# Patient Record
Sex: Female | Born: 1995 | Race: White | Hispanic: No | Marital: Single | Smoking: Current every day smoker
Health system: Southern US, Community
[De-identification: ages and names within clinical notes are randomized; demographics above are authoritative.]

## PROBLEM LIST (undated history)

## (undated) HISTORY — PX: TONSILLECTOMY: SUR1361

---

## 2015-06-12 ENCOUNTER — Emergency Department
Admission: EM | Admit: 2015-06-12 | Discharge: 2015-06-12 | Disposition: A | Discharge status: Home / Self Care | Payer: Self-pay | Attending: Emergency Medicine | Admitting: Emergency Medicine

## 2015-06-12 ENCOUNTER — Emergency Department: Payer: Self-pay

## 2015-06-12 DIAGNOSIS — W01198A Fall on same level from slipping, tripping and stumbling with subsequent striking against other object, initial encounter: Secondary | ICD-10-CM | POA: Insufficient documentation

## 2015-06-12 DIAGNOSIS — Y9289 Other specified places as the place of occurrence of the external cause: Secondary | ICD-10-CM | POA: Insufficient documentation

## 2015-06-12 DIAGNOSIS — M545 Low back pain, unspecified: Secondary | ICD-10-CM

## 2015-06-12 DIAGNOSIS — Y9389 Activity, other specified: Secondary | ICD-10-CM | POA: Insufficient documentation

## 2015-06-12 DIAGNOSIS — S3992XA Unspecified injury of lower back, initial encounter: Secondary | ICD-10-CM | POA: Insufficient documentation

## 2015-06-12 DIAGNOSIS — Y998 Other external cause status: Secondary | ICD-10-CM | POA: Insufficient documentation

## 2015-06-12 DIAGNOSIS — Z3202 Encounter for pregnancy test, result negative: Secondary | ICD-10-CM | POA: Insufficient documentation

## 2015-06-12 LAB — PREGNANCY, URINE: Preg Test, Ur: NEGATIVE

## 2015-06-12 MED ORDER — DIAZEPAM 2 MG PO TABS: 2.0000 mg | ORAL_TABLET | Freq: Three times a day (TID) | ORAL | Status: AC | PRN

## 2015-06-12 MED ORDER — HYDROCODONE-ACETAMINOPHEN 5-325 MG PO TABS: 1.0000 | ORAL_TABLET | Freq: Four times a day (QID) | ORAL | Status: AC | PRN

## 2015-06-12 MED ORDER — DIAZEPAM 2 MG PO TABS
2.0000 mg | ORAL_TABLET | Freq: Once | ORAL | Status: AC
  Administered 2015-06-12: 2 mg via ORAL
  Filled 2015-06-12: qty 1

## 2015-06-12 MED ORDER — HYDROCODONE-ACETAMINOPHEN 5-325 MG PO TABS
1.0000 | ORAL_TABLET | Freq: Once | ORAL | Status: AC
  Administered 2015-06-12: 1 via ORAL
  Filled 2015-06-12: qty 1

## 2015-06-12 MED ORDER — KETOROLAC TROMETHAMINE 30 MG/ML IJ SOLN
60.0000 mg | Freq: Once | INTRAMUSCULAR | Status: AC
  Administered 2015-06-12: 60 mg via INTRAMUSCULAR
  Filled 2015-06-12: qty 2

## 2015-06-12 MED ORDER — IBUPROFEN 800 MG PO TABS: 800.0000 mg | ORAL_TABLET | Freq: Three times a day (TID) | ORAL | Status: AC | PRN

## 2015-06-12 NOTE — ED Notes (Signed)
Pt ambulatory to triage with slow but steady gait. Pt reports she has hx of DDD and has had low back pain for over 10 months. Pt reports about 1 week ago she slipped and fell in the tub. Pt reports having worse low back pain and it is not getting any better.

## 2015-06-12 NOTE — Discharge Instructions (Signed)
1. You may take medicines as needed for pain and muscle spasms (Motrin/Norco/Valium #15). 2. Apply moist heat to affected area several times daily. 3. Return to the ER for worsening symptoms, persistent vomiting, losing control her bowel or bladder or other concerns.  Back Pain, Adult Back pain is very common in adults.The cause of back pain is rarely dangerous and the pain often gets better over time.The cause of your back pain may not be known. Some common causes of back pain include:  Strain of the muscles or ligaments supporting the spine.  Wear and tear (degeneration) of the spinal disks.  Arthritis.  Direct injury to the back. For many people, back pain may return. Since back pain is rarely dangerous, most people can learn to manage this condition on their own. HOME CARE INSTRUCTIONS Watch your back pain for any changes. The following actions may help to lessen any discomfort you are feeling:  Remain active. It is stressful on your back to sit or stand in one place for long periods of time. Do not sit, drive, or stand in one place for more than 30 minutes at a time. Take short walks on even surfaces as soon as you are able.Try to increase the length of time you walk each day.  Exercise regularly as directed by your health care provider. Exercise helps your back heal faster. It also helps avoid future injury by keeping your muscles strong and flexible.  Do not stay in bed.Resting more than 1-2 days can delay your recovery.  Pay attention to your body when you bend and lift. The most comfortable positions are those that put less stress on your recovering back. Always use proper lifting techniques, including:  Bending your knees.  Keeping the load close to your body.  Avoiding twisting.  Find a comfortable position to sleep. Use a firm mattress and lie on your side with your knees slightly bent. If you lie on your back, put a pillow under your knees.  Avoid feeling anxious or  stressed.Stress increases muscle tension and can worsen back pain.It is important to recognize when you are anxious or stressed and learn ways to manage it, such as with exercise.  Take medicines only as directed by your health care provider. Over-the-counter medicines to reduce pain and inflammation are often the most helpful.Your health care provider may prescribe muscle relaxant drugs.These medicines help dull your pain so you can more quickly return to your normal activities and healthy exercise.  Apply ice to the injured area:  Put ice in a plastic bag.  Place a towel between your skin and the bag.  Leave the ice on for 20 minutes, 2-3 times a day for the first 2-3 days. After that, ice and heat may be alternated to reduce pain and spasms.  Maintain a healthy weight. Excess weight puts extra stress on your back and makes it difficult to maintain good posture. SEEK MEDICAL CARE IF:  You have pain that is not relieved with rest or medicine.  You have increasing pain going down into the legs or buttocks.  You have pain that does not improve in one week.  You have night pain.  You lose weight.  You have a fever or chills. SEEK IMMEDIATE MEDICAL CARE IF:   You develop new bowel or bladder control problems.  You have unusual weakness or numbness in your arms or legs.  You develop nausea or vomiting.  You develop abdominal pain.  You feel faint.   This information  is not intended to replace advice given to you by your health care provider. Make sure you discuss any questions you have with your health care provider.   Document Released: 07/24/2005 Document Revised: 08/14/2014 Document Reviewed: 11/25/2013 Elsevier Interactive Patient Education 2016 [employer]  [employer] Therapy Heat therapy can help ease sore, stiff, injured, and tight muscles and joints. Heat relaxes your muscles, which may help ease your pain.  RISKS AND COMPLICATIONS If you have any of the following  conditions, do not use heat therapy unless your health care provider has approved:  Poor circulation.  Healing wounds or scarred skin in the area being treated.  Diabetes, heart disease, or high blood pressure.  Not being able to feel (numbness) the area being treated.  Unusual swelling of the area being treated.  Active infections.  Blood clots.  Cancer.  Inability to communicate pain. This may include young children and people who have problems with their brain function (dementia).  Pregnancy. Heat therapy should only be used on old, pre-existing, or long-lasting (chronic) injuries. Do not use heat therapy on new injuries unless directed by your health care provider. HOW TO USE HEAT THERAPY There are several different kinds of heat therapy, including:  Moist heat pack.  Warm water bath.  Hot water bottle.  Electric heating pad.  Heated gel pack.  Heated wrap.  Electric heating pad. Use the heat therapy method suggested by your health care provider. Follow your health care provider's instructions on when and how to use heat therapy. GENERAL HEAT THERAPY RECOMMENDATIONS  Do not sleep while using heat therapy. Only use heat therapy while you are awake.  Your skin may turn pink while using heat therapy. Do not use heat therapy if your skin turns red.  Do not use heat therapy if you have new pain.  High heat or long exposure to heat can cause burns. Be careful when using heat therapy to avoid burning your skin.  Do not use heat therapy on areas of your skin that are already irritated, such as with a rash or sunburn. SEEK MEDICAL CARE IF:  You have blisters, redness, swelling, or numbness.  You have new pain.  Your pain is worse. MAKE SURE YOU:  Understand these instructions.  Will watch your condition.  Will get help right away if you are not doing well or get worse.   This information is not intended to replace advice given to you by your health care  provider. Make sure you discuss any questions you have with your health care provider.   Document Released: 10/16/2011 Document Revised: 08/14/2014 Document Reviewed: 09/16/2013 Elsevier Interactive Patient Education [employer]

## 2015-06-12 NOTE — ED Provider Notes (Signed)
[facility] Emergency Department Provider Note  ____________________________________________  Time seen: Approximately 1:04 AM  I have reviewed the triage vital signs and the nursing notes.   HISTORY  Chief Complaint Back Pain    HPI Drucilla SchmidtKali Rae Kihara is a 19 y.o. female who presents to the ED from home with a chief complaint of low back pain.Reports she has a history of degenerative disks and has had low back pain 10 months. Reports she slipped and fell in the tub approximately one week ago and presents with increasing lower back pain. Denies radiation or associated symptoms of bowel/bladder incontinence, numbness or tingling. Her previous prescription of hydrocodone helps her pain somewhat, movement makes her pain worse.   Past Medical history DDD   There are no active problems to display for this patient.   Past Surgical history None for back surgery  No current outpatient prescriptions on file.  Allergies Review of patient's allergies indicates no known allergies.  No family history on file.  Social History Social History  Substance Use Topics  . Smoking status: Not on file  . Smokeless tobacco: Not on file  . Alcohol Use: Not on file   no recent alcohol  Review of Systems Constitutional: No fever/chills Eyes: No visual changes. ENT: No sore throat. Cardiovascular: Denies chest pain. Respiratory: Denies shortness of breath. Gastrointestinal: No abdominal pain.  No nausea, no vomiting.  No diarrhea.  No constipation. Genitourinary: Negative for dysuria. Musculoskeletal: Positive for back pain. Skin: Negative for rash. Neurological: Negative for headaches, focal weakness or numbness.  10-point ROS otherwise negative.  ____________________________________________   PHYSICAL EXAM:  VITAL SIGNS: ED Triage Vitals  Enc Vitals Group     BP 06/12/15 0048 155/93 mmHg     Pulse Rate 06/12/15 0048 87     Resp 06/12/15 0048 18   Temp 06/12/15 0048 98.1 F (36.7 C)     Temp Source 06/12/15 0048 Oral     SpO2 06/12/15 0048 98 %     Weight 06/12/15 0048 210 lb (95.255 kg)     Height 06/12/15 0048 5\' 5"  (1.651 m)     Head Cir --      Peak Flow --      Pain Score 06/12/15 0048 8     Pain Loc --      Pain Edu? --      Excl. in GC? --     Constitutional: Alert and oriented. Well appearing and in mild acute distress. Eyes: Conjunctivae are normal. PERRL. EOMI. Head: Atraumatic. Nose: No congestion/rhinnorhea. Mouth/Throat: Mucous membranes are moist.  Oropharynx non-erythematous. Neck: No stridor.  No cervical spine tenderness to palpation. Cardiovascular: Normal rate, regular rhythm. Grossly normal heart sounds.  Good peripheral circulation. Respiratory: Normal respiratory effort.  No retractions. Lungs CTAB. Gastrointestinal: Soft and nontender. No distention. No abdominal bruits. No CVA tenderness. Musculoskeletal: Tender to palpation midline lumbar spine. No step-offs or deformities palpated. Paraspinal muscle spasms. No lower extremity tenderness nor edema.  No joint effusions. Neurologic:  Normal speech and language. No gross focal neurologic deficits are appreciated. Skin:  Skin is warm, dry and intact. No rash noted. Psychiatric: Mood and affect are normal. Speech and behavior are normal.  ____________________________________________   LABS (all labs ordered are listed, but only abnormal results are displayed)  Labs Reviewed - No data to display ____________________________________________  EKG  None ____________________________________________  RADIOLOGY  Lumbar spine x-rays (viewed by me, interpreted per Dr. Karie KirksBloomer): Negative. ____________________________________________   PROCEDURES  Procedure(s)  performed: None  Critical Care performed: No  ____________________________________________   INITIAL IMPRESSION / ASSESSMENT AND PLAN / ED COURSE  Pertinent labs & imaging results that  were available during my care of the patient were reviewed by me and considered in my medical decision making (see chart for details).  19 year old female who presents with lumbar spine pain s/p fall approximately one week ago. Will obtain imaging studies, administer IM analgesia and reassess.  ----------------------------------------- 2:51 AM on 06/12/2015 -----------------------------------------  Patient improved. Strict return precautions given. Patient verbalizes understanding and agrees with plan of care. ____________________________________________   FINAL CLINICAL IMPRESSION(S) / ED DIAGNOSES  Final diagnoses:  Midline low back pain without sciatica      Irean Hong, MD 06/12/15 [id number]

## 2015-06-24 ENCOUNTER — Emergency Department
Admission: EM | Admit: 2015-06-24 | Discharge: 2015-06-24 | Disposition: A | Discharge status: Home / Self Care | Payer: Self-pay | Attending: Emergency Medicine | Admitting: Emergency Medicine

## 2015-06-24 ENCOUNTER — Encounter: Payer: Self-pay | Admitting: Emergency Medicine

## 2015-06-24 DIAGNOSIS — Z3202 Encounter for pregnancy test, result negative: Secondary | ICD-10-CM | POA: Insufficient documentation

## 2015-06-24 DIAGNOSIS — M545 Low back pain, unspecified: Secondary | ICD-10-CM

## 2015-06-24 DIAGNOSIS — F17210 Nicotine dependence, cigarettes, uncomplicated: Secondary | ICD-10-CM | POA: Insufficient documentation

## 2015-06-24 LAB — URINALYSIS COMPLETE WITH MICROSCOPIC (ARMC ONLY)
BILIRUBIN URINE: NEGATIVE
Bacteria, UA: NONE SEEN
GLUCOSE, UA: NEGATIVE mg/dL
HGB URINE DIPSTICK: NEGATIVE
Ketones, ur: NEGATIVE mg/dL
NITRITE: NEGATIVE
PH: 5 (ref 5.0–8.0)
Protein, ur: NEGATIVE mg/dL
SPECIFIC GRAVITY, URINE: 1.016 (ref 1.005–1.030)

## 2015-06-24 LAB — POCT PREGNANCY, URINE: Preg Test, Ur: NEGATIVE

## 2015-06-24 MED ORDER — CYCLOBENZAPRINE HCL 5 MG PO TABS: 5.0000 mg | ORAL_TABLET | Freq: Three times a day (TID) | ORAL | Status: AC | PRN

## 2015-06-24 MED ORDER — TRAMADOL HCL 50 MG PO TABS
50.0000 mg | ORAL_TABLET | Freq: Once | ORAL | Status: AC
  Administered 2015-06-24: 50 mg via ORAL
  Filled 2015-06-24: qty 1

## 2015-06-24 NOTE — ED Notes (Addendum)
Patient ambulatory to triage with steady gait, without difficulty or distress noted; pt reports being seen here for lower back pack few weeks ago; c/o persistent pain but didn't f/u due to lack of insurance

## 2015-06-24 NOTE — ED Provider Notes (Signed)
[facility] Emergency Department Provider Note  ____________________________________________   I have reviewed the triage vital signs and the nursing notes.   HISTORY  Chief Complaint Back Pain    HPI Lori Perez is a 19 y.o. female who presents today complaining of low back pain. She has had low back pain she states now for 10 years. He gets worse and then gets better. She is about to start her menstrual period and that usually makes it worse. She has had no fever no chills no nausea no vomiting. She states it's been worse over the last year. She has not seen anyone for it. She has had no numbness no weakness no incontinence of bowel or recent fall since last time she was seen here. Did have negative x-rays recently. States that she occasionally takes ibuprofen for this pain but "doesn't touch it".  History reviewed. No pertinent past medical history.  There are no active problems to display for this patient.   Past Surgical History  Procedure Laterality Date  . Tonsillectomy      Current Outpatient Rx  Name  Route  Sig  Dispense  Refill  . diazepam (VALIUM) 2 MG tablet   Oral   Take 1 tablet (2 mg total) by mouth every 8 (eight) hours as needed for muscle spasms. Patient not taking: Reported on 06/24/2015   15 tablet   0   . HYDROcodone-acetaminophen (NORCO) 5-325 MG tablet   Oral   Take 1 tablet by mouth every 6 (six) hours as needed for moderate pain. Patient not taking: Reported on 06/24/2015   15 tablet   0   . ibuprofen (ADVIL,MOTRIN) 800 MG tablet   Oral   Take 1 tablet (800 mg total) by mouth every 8 (eight) hours as needed for moderate pain. Patient not taking: Reported on 06/24/2015   15 tablet   0     Allergies Review of patient's allergies indicates no known allergies.  No family history on file.  Social History Social History  Substance Use Topics  . Smoking status: Current Every Day Smoker -- 0.50 packs/day   Types: Cigarettes  . Smokeless tobacco: None  . Alcohol Use: No    Review of Systems Constitutional: No fever/chills Eyes: No visual changes. ENT: No sore throat. No stiff neck no neck pain Cardiovascular: Denies chest pain. Respiratory: Denies shortness of breath. Gastrointestinal:   no vomiting.  No diarrhea.  No constipation. Genitourinary: Negative for dysuria. Musculoskeletal: Negative lower extremity swelling Skin: Negative for rash. Neurological: Negative for headaches, focal weakness or numbness. 10-point ROS otherwise negative.  ____________________________________________   PHYSICAL EXAM:  VITAL SIGNS: ED Triage Vitals  Enc Vitals Group     BP 06/24/15 0502 145/57 mmHg     Pulse Rate 06/24/15 0502 78     Resp 06/24/15 0502 20     Temp 06/24/15 0502 97.9 F (36.6 C)     Temp Source 06/24/15 0502 Oral     SpO2 06/24/15 0502 99 %     Weight 06/24/15 0502 215 lb (97.523 kg)     Height 06/24/15 0502 5\' 5"  (1.651 m)     Head Cir --      Peak Flow --      Pain Score 06/24/15 0500 9     Pain Loc --      Pain Edu? --      Excl. in GC? --     Constitutional: Alert and oriented. Well appearing and in no  acute distress. Eyes: Conjunctivae are normal. PERRL. EOMI. Head: Atraumatic. Nose: No congestion/rhinnorhea. Mouth/Throat: Mucous membranes are moist.  Oropharynx non-erythematous. Neck: No stridor.   Nontender with no meningismus Cardiovascular: Normal rate, regular rhythm. Grossly normal heart sounds.  Good peripheral circulation. Respiratory: Normal respiratory effort.  No retractions. Lungs CTAB. Abdominal: Soft and nontender. No distention. No guarding no rebound Back:  There is tenderness to palpation in the paraspinal muscles of the left lower back no flank pain, no lesions noted, no midline discomfort there is no midline tenderness there are no lesions noted. there is no CVA tenderness Musculoskeletal: No lower extremity tenderness. No joint effusions, no  DVT signs strong distal pulses no edema Neurologic:  Normal speech and language. No gross focal neurologic deficits are appreciated. No saddle anesthesia reflexes are 2+ and symmetric right leg raise is negative Skin:  Skin is warm, dry and intact. No rash noted. Psychiatric: Mood and affect are normal. Speech and behavior are normal.  ____________________________________________   LABS (all labs ordered are listed, but only abnormal results are displayed)  Labs Reviewed  URINALYSIS COMPLETEWITH MICROSCOPIC ([facility] ONLY)  POCT PREGNANCY, URINE  POC URINE PREG, ED   ____________________________________________  EKG  I personally interpreted any EKGs ordered by me or triage  ____________________________________________  RADIOLOGY  I reviewed any imaging ordered by me or triage that were performed during my shift ____________________________________________   PROCEDURES  Procedure(s) performed: None  Critical Care performed: None  ____________________________________________   INITIAL IMPRESSION / ASSESSMENT AND PLAN / ED COURSE  Pertinent labs & imaging results that were available during my care of the patient were reviewed by me and considered in my medical decision making (see chart for details).  Patient with chronic back pain presents with her chronic back pain without injury, she is neurologically intact there's nothing to suggest cauda equina syndrome, we'll check a urine urgency and urinalysis and we will see if we can try home nonnarcotic intervention for this. I'll refer her to orthopedic surgery/spine doctors for further evaluation. Return precautions and follow-up given and understood. ____________________________________________   FINAL CLINICAL IMPRESSION(S) / ED DIAGNOSES  Final diagnoses:  None     Jeanmarie Plant, MD 06/24/15 [phone]

## 2015-06-24 NOTE — Discharge Instructions (Signed)
Back Exercises °The following exercises strengthen the muscles that help to support the back. They also help to keep the lower back flexible. Doing these exercises can help to prevent back pain or lessen existing pain. °If you have back pain or discomfort, try doing these exercises 2-3 times each day or as told by your health care provider. When the pain goes away, do them once each day, but increase the number of times that you repeat the steps for each exercise (do more repetitions). If you do not have back pain or discomfort, do these exercises once each day or as told by your health care provider. °EXERCISES °Single Knee to Chest °Repeat these steps 3-5 times for each leg: °· Lie on your back on a firm bed or the floor with your legs extended. °· Bring one knee to your chest. Your other leg should stay extended and in contact with the floor. °· Hold your knee in place by grabbing your knee or thigh. °· Pull on your knee until you feel a gentle stretch in your lower back. °· Hold the stretch for 10-30 seconds. °· Slowly release and straighten your leg. °Pelvic Tilt °Repeat these steps 5-10 times: °· Lie on your back on a firm bed or the floor with your legs extended. °· Bend your knees so they are pointing toward the ceiling and your feet are flat on the floor. °· Tighten your lower abdominal muscles to press your lower back against the floor. This motion will tilt your pelvis so your tailbone points up toward the ceiling instead of pointing to your feet or the floor. °· With gentle tension and even breathing, hold this position for 5-10 seconds. °Cat-Cow °Repeat these steps until your lower back becomes more flexible: °· Get into a hands-and-knees position on a firm surface. Keep your hands under your shoulders, and keep your knees under your hips. You may place padding under your knees for comfort. °· Let your head hang down, and point your tailbone toward the floor so your lower back becomes rounded like the  back of a cat. °· Hold this position for 5 seconds. °· Slowly lift your head and point your tailbone up toward the ceiling so your back forms a sagging arch like the back of a cow. °· Hold this position for 5 seconds. °Press-Ups °Repeat these steps 5-10 times: °· Lie on your abdomen (face-down) on the floor. °· Place your palms near your head, about shoulder-width apart. °· While you keep your back as relaxed as possible and keep your hips on the floor, slowly straighten your arms to raise the top half of your body and lift your shoulders. Do not use your back muscles to raise your upper torso. You may adjust the placement of your hands to make yourself more comfortable. °· Hold this position for 5 seconds while you keep your back relaxed. °· Slowly return to lying flat on the floor. °Bridges °Repeat these steps 10 times: °1. Lie on your back on a firm surface. °2. Bend your knees so they are pointing toward the ceiling and your feet are flat on the floor. °3. Tighten your buttocks muscles and lift your buttocks off of the floor until your waist is at almost the same height as your knees. You should feel the muscles working in your buttocks and the back of your thighs. If you do not feel these muscles, slide your feet 1-2 inches farther away from your buttocks. °4. Hold this position for 3-5   seconds. °5. Slowly lower your hips to the starting position, and allow your buttocks muscles to relax completely. °If this exercise is too easy, try doing it with your arms crossed over your chest. °Abdominal Crunches °Repeat these steps 5-10 times: °1. Lie on your back on a firm bed or the floor with your legs extended. °2. Bend your knees so they are pointing toward the ceiling and your feet are flat on the floor. °3. Cross your arms over your chest. °4. Tip your chin slightly toward your chest without bending your neck. °5. Tighten your abdominal muscles and slowly raise your trunk (torso) high enough to lift your shoulder  blades a tiny bit off of the floor. Avoid raising your torso higher than that, because it can put too much stress on your low back and it does not help to strengthen your abdominal muscles. °6. Slowly return to your starting position. °Back Lifts °Repeat these steps 5-10 times: °1. Lie on your abdomen (face-down) with your arms at your sides, and rest your forehead on the floor. °2. Tighten the muscles in your legs and your buttocks. °3. Slowly lift your chest off of the floor while you keep your hips pressed to the floor. Keep the back of your head in line with the curve in your back. Your eyes should be looking at the floor. °4. Hold this position for 3-5 seconds. °5. Slowly return to your starting position. °SEEK MEDICAL CARE IF: °· Your back pain or discomfort gets much worse when you do an exercise. °· Your back pain or discomfort does not lessen within 2 hours after you exercise. °If you have any of these problems, stop doing these exercises right away. Do not do them again unless your health care provider says that you can. °SEEK IMMEDIATE MEDICAL CARE IF: °· You develop sudden, severe back pain. If this happens, stop doing the exercises right away. Do not do them again unless your health care provider says that you can. °  °This information is not intended to replace advice given to you by your health care provider. Make sure you discuss any questions you have with your health care provider. °  °Document Released: 08/31/2004 Document Revised: 04/14/2015 Document Reviewed: 09/17/2014 °Elsevier Interactive Patient Education ©2016 [employer] ° °Back Pain, Adult °Back pain is very common in adults. The cause of back pain is rarely dangerous and the pain often gets better over time. The cause of your back pain may not be known. Some common causes of back pain include: °· Strain of the muscles or ligaments supporting the spine. °· Wear and tear (degeneration) of the spinal disks. °· Arthritis. °· Direct injury  to the back. °For many people, back pain may return. Since back pain is rarely dangerous, most people can learn to manage this condition on their own. °HOME CARE INSTRUCTIONS °Watch your back pain for any changes. The following actions may help to lessen any discomfort you are feeling: °· Remain active. It is stressful on your back to sit or stand in one place for long periods of time. Do not sit, drive, or stand in one place for more than 30 minutes at a time. Take short walks on even surfaces as soon as you are able. Try to increase the length of time you walk each day. °· Exercise regularly as directed by your health care provider. Exercise helps your back heal faster. It also helps avoid future injury by keeping your muscles strong and flexible. °· Do not stay in   bed. Resting more than 1-2 days can delay your recovery. °· Pay attention to your body when you bend and lift. The most comfortable positions are those that put less stress on your recovering back. Always use proper lifting techniques, including: °¨ Bending your knees. °¨ Keeping the load close to your body. °¨ Avoiding twisting. °· Find a comfortable position to sleep. Use a firm mattress and lie on your side with your knees slightly bent. If you lie on your back, put a pillow under your knees. °· Avoid feeling anxious or stressed. Stress increases muscle tension and can worsen back pain. It is important to recognize when you are anxious or stressed and learn ways to manage it, such as with exercise. °· Take medicines only as directed by your health care provider. Over-the-counter medicines to reduce pain and inflammation are often the most helpful. Your health care provider may prescribe muscle relaxant drugs. These medicines help dull your pain so you can more quickly return to your normal activities and healthy exercise. °· Apply ice to the injured area: °¨ Put ice in a plastic bag. °¨ Place a towel between your skin and the bag. °¨ Leave the ice on  for 20 minutes, 2-3 times a day for the first 2-3 days. After that, ice and heat may be alternated to reduce pain and spasms. °· Maintain a healthy weight. Excess weight puts extra stress on your back and makes it difficult to maintain good posture. °SEEK MEDICAL CARE IF: °· You have pain that is not relieved with rest or medicine. °· You have increasing pain going down into the legs or buttocks. °· You have pain that does not improve in one week. °· You have night pain. °· You lose weight. °· You have a fever or chills. °SEEK IMMEDIATE MEDICAL CARE IF:  °· You develop new bowel or bladder control problems. °· You have unusual weakness or numbness in your arms or legs. °· You develop nausea or vomiting. °· You develop abdominal pain. °· You feel faint. °  °This information is not intended to replace advice given to you by your health care provider. Make sure you discuss any questions you have with your health care provider. °  °Document Released: 07/24/2005 Document Revised: 08/14/2014 Document Reviewed: 11/25/2013 °Elsevier Interactive Patient Education ©2016 [employer] ° °

## 2015-10-18 ENCOUNTER — Other Ambulatory Visit: Payer: Self-pay | Admitting: Physical Medicine and Rehabilitation

## 2015-10-18 ENCOUNTER — Ambulatory Visit
Admission: RE | Admit: 2015-10-18 | Discharge: 2015-10-18 | Disposition: A | Discharge status: Home / Self Care | Payer: Disability Insurance | Source: Ambulatory Visit | Attending: Physical Medicine and Rehabilitation | Admitting: Physical Medicine and Rehabilitation

## 2015-10-18 DIAGNOSIS — M419 Scoliosis, unspecified: Secondary | ICD-10-CM

## 2015-10-18 DIAGNOSIS — G8929 Other chronic pain: Secondary | ICD-10-CM | POA: Diagnosis present

## 2015-10-18 DIAGNOSIS — M545 Low back pain: Secondary | ICD-10-CM | POA: Diagnosis present

## 2015-10-18 DIAGNOSIS — M549 Dorsalgia, unspecified: Secondary | ICD-10-CM

## 2015-12-12 ENCOUNTER — Emergency Department: Payer: Disability Insurance

## 2015-12-12 ENCOUNTER — Emergency Department
Admission: EM | Admit: 2015-12-12 | Discharge: 2015-12-12 | Disposition: A | Discharge status: Home / Self Care | Payer: Disability Insurance | Attending: Emergency Medicine | Admitting: Emergency Medicine

## 2015-12-12 ENCOUNTER — Encounter: Payer: Self-pay | Admitting: *Deleted

## 2015-12-12 DIAGNOSIS — M940 Chondrocostal junction syndrome [Tietze]: Secondary | ICD-10-CM | POA: Insufficient documentation

## 2015-12-12 DIAGNOSIS — J209 Acute bronchitis, unspecified: Secondary | ICD-10-CM | POA: Insufficient documentation

## 2015-12-12 DIAGNOSIS — F17210 Nicotine dependence, cigarettes, uncomplicated: Secondary | ICD-10-CM | POA: Insufficient documentation

## 2015-12-12 DIAGNOSIS — Z791 Long term (current) use of non-steroidal anti-inflammatories (NSAID): Secondary | ICD-10-CM | POA: Insufficient documentation

## 2015-12-12 LAB — TROPONIN I

## 2015-12-12 LAB — CBC
HEMATOCRIT: 37.9 % (ref 35.0–47.0)
Hemoglobin: 12.7 g/dL (ref 12.0–16.0)
MCH: 28 pg (ref 26.0–34.0)
MCHC: 33.7 g/dL (ref 32.0–36.0)
MCV: 83.1 fL (ref 80.0–100.0)
Platelets: 240 10*3/uL (ref 150–440)
RBC: 4.56 MIL/uL (ref 3.80–5.20)
RDW: 13.9 % (ref 11.5–14.5)
WBC: 12.7 10*3/uL — AB (ref 3.6–11.0)

## 2015-12-12 LAB — BASIC METABOLIC PANEL
Anion gap: 8 (ref 5–15)
BUN: 16 mg/dL (ref 6–20)
CHLORIDE: 105 mmol/L (ref 101–111)
CO2: 25 mmol/L (ref 22–32)
CREATININE: 0.65 mg/dL (ref 0.44–1.00)
Calcium: 8.5 mg/dL — ABNORMAL LOW (ref 8.9–10.3)
Glucose, Bld: 93 mg/dL (ref 65–99)
POTASSIUM: 4 mmol/L (ref 3.5–5.1)
Sodium: 138 mmol/L (ref 135–145)

## 2015-12-12 MED ORDER — PREDNISONE 20 MG PO TABS: 40.0000 mg | ORAL_TABLET | Freq: Every day | ORAL | Status: AC

## 2015-12-12 MED ORDER — ALBUTEROL SULFATE (2.5 MG/3ML) 0.083% IN NEBU
INHALATION_SOLUTION | RESPIRATORY_TRACT | Status: AC
  Filled 2015-12-12: qty 6

## 2015-12-12 MED ORDER — RANITIDINE HCL 150 MG PO CAPS: 150.0000 mg | ORAL_CAPSULE | Freq: Two times a day (BID) | ORAL | Status: AC

## 2015-12-12 MED ORDER — ALBUTEROL SULFATE HFA 108 (90 BASE) MCG/ACT IN AERS: 2.0000 | INHALATION_SPRAY | RESPIRATORY_TRACT | Status: AC | PRN

## 2015-12-12 MED ORDER — ALBUTEROL SULFATE (2.5 MG/3ML) 0.083% IN NEBU
5.0000 mg | INHALATION_SOLUTION | Freq: Once | RESPIRATORY_TRACT | Status: AC
  Administered 2015-12-12: 5 mg via RESPIRATORY_TRACT

## 2015-12-12 MED ORDER — NAPROXEN 500 MG PO TABS: 500.0000 mg | ORAL_TABLET | Freq: Two times a day (BID) | ORAL | Status: AC

## 2015-12-12 NOTE — ED Notes (Signed)
Pt arrived to ED with SOB and chest pain that pt reports has gotten worse this morning and throughout the day. Pt reports having been sick for 3 days with a cough, nausea and vomiting. Pt presents tachypnic and unable to slow down breathing. Pt denies hx of asthma. Pt reports dizziness and lightheadedness stating, "I feel like Im going to pass out."

## 2015-12-12 NOTE — ED Notes (Signed)
Pt states she has been sick for a couple of days and now her "chest hurts just below her throat".

## 2015-12-12 NOTE — Discharge Instructions (Signed)
Acute Bronchitis Bronchitis is inflammation of the airways that extend from the windpipe into the lungs (bronchi). The inflammation often causes mucus to develop. This leads to a cough, which is the most common symptom of bronchitis.  In acute bronchitis, the condition usually develops suddenly and goes away over time, usually in a couple weeks. Smoking, allergies, and asthma can make bronchitis worse. Repeated episodes of bronchitis may cause further lung problems.  CAUSES Acute bronchitis is most often caused by the same virus that causes a cold. The virus can spread from person to person (contagious) through coughing, sneezing, and touching contaminated objects. SIGNS AND SYMPTOMS   Cough.   Fever.   Coughing up mucus.   Body aches.   Chest congestion.   Chills.   Shortness of breath.   Sore throat.  DIAGNOSIS  Acute bronchitis is usually diagnosed through a physical exam. Your health care provider will also ask you questions about your medical history. Tests, such as chest X-rays, are sometimes done to rule out other conditions.  TREATMENT  Acute bronchitis usually goes away in a couple weeks. Oftentimes, no medical treatment is necessary. Medicines are sometimes given for relief of fever or cough. Antibiotic medicines are usually not needed but may be prescribed in certain situations. In some cases, an inhaler may be recommended to help reduce shortness of breath and control the cough. A cool mist vaporizer may also be used to help thin bronchial secretions and make it easier to clear the chest.  HOME CARE INSTRUCTIONS  Get plenty of rest.   Drink enough fluids to keep your urine clear or pale yellow (unless you have a medical condition that requires fluid restriction). Increasing fluids may help thin your respiratory secretions (sputum) and reduce chest congestion, and it will prevent dehydration.   Take medicines only as directed by your health care provider.  If  you were prescribed an antibiotic medicine, finish it all even if you start to feel better.  Avoid smoking and secondhand smoke. Exposure to cigarette smoke or irritating chemicals will make bronchitis worse. If you are a smoker, consider using nicotine gum or skin patches to help control withdrawal symptoms. Quitting smoking will help your lungs heal faster.   Reduce the chances of another bout of acute bronchitis by washing your hands frequently, avoiding people with cold symptoms, and trying not to touch your hands to your mouth, nose, or eyes.   Keep all follow-up visits as directed by your health care provider.  SEEK MEDICAL CARE IF: Your symptoms do not improve after 1 week of treatment.  SEEK IMMEDIATE MEDICAL CARE IF:  You develop an increased fever or chills.   You have chest pain.   You have severe shortness of breath.  You have bloody sputum.   You develop dehydration.  You faint or repeatedly feel like you are going to pass out.  You develop repeated vomiting.  You develop a severe headache. MAKE SURE YOU:   Understand these instructions.  Will watch your condition.  Will get help right away if you are not doing well or get worse.   This information is not intended to replace advice given to you by your health care provider. Make sure you discuss any questions you have with your health care provider.   Document Released: 08/31/2004 Document Revised: 08/14/2014 Document Reviewed: 01/14/2013 Elsevier Interactive Patient Education 2016 [employer]  Chest Wall Pain Chest wall pain is pain in or around the bones and muscles of your chest.  Sometimes, an injury causes this pain. Sometimes, the cause may not be known. This pain may take several weeks or longer to get better. HOME CARE INSTRUCTIONS  Pay attention to any changes in your symptoms. Take these actions to help with your pain:   Rest as told by your health care provider.   Avoid activities that  cause pain. These include any activities that use your chest muscles or your abdominal and side muscles to lift heavy items.   If directed, apply ice to the painful area:  Put ice in a plastic bag.  Place a towel between your skin and the bag.  Leave the ice on for 20 minutes, 2-3 times per day.  Take over-the-counter and prescription medicines only as told by your health care provider.  Do not use tobacco products, including cigarettes, chewing tobacco, and e-cigarettes. If you need help quitting, ask your health care provider.  Keep all follow-up visits as told by your health care provider. This is important. SEEK MEDICAL CARE IF:  You have a fever.  Your chest pain becomes worse.  You have new symptoms. SEEK IMMEDIATE MEDICAL CARE IF:  You have nausea or vomiting.  You feel sweaty or light-headed.  You have a cough with phlegm (sputum) or you cough up blood.  You develop shortness of breath.   This information is not intended to replace advice given to you by your health care provider. Make sure you discuss any questions you have with your health care provider.   Document Released: 07/24/2005 Document Revised: 04/14/2015 Document Reviewed: 10/19/2014 Elsevier Interactive Patient Education 2016 [employer]  Costochondritis Costochondritis, sometimes called Tietze syndrome, is a swelling and irritation (inflammation) of the tissue (cartilage) that connects your ribs with your breastbone (sternum). It causes pain in the chest and rib area. Costochondritis usually goes away on its own over time. It can take up to 6 weeks or longer to get better, especially if you are unable to limit your activities. CAUSES  Some cases of costochondritis have no known cause. Possible causes include:  Injury (trauma).  Exercise or activity such as lifting.  Severe coughing. SIGNS AND SYMPTOMS  Pain and tenderness in the chest and rib area.  Pain that gets worse when coughing or  taking deep breaths.  Pain that gets worse with specific movements. DIAGNOSIS  Your health care provider will do a physical exam and ask about your symptoms. Chest X-rays or other tests may be done to rule out other problems. TREATMENT  Costochondritis usually goes away on its own over time. Your health care provider may prescribe medicine to help relieve pain. HOME CARE INSTRUCTIONS   Avoid exhausting physical activity. Try not to strain your ribs during normal activity. This would include any activities using chest, abdominal, and side muscles, especially if heavy weights are used.  Apply ice to the affected area for the first 2 days after the pain begins.  Put ice in a plastic bag.  Place a towel between your skin and the bag.  Leave the ice on for 20 minutes, 2-3 times a day.  Only take over-the-counter or prescription medicines as directed by your health care provider. SEEK MEDICAL CARE IF:  You have redness or swelling at the rib joints. These are signs of infection.  Your pain does not go away despite rest or medicine. SEEK IMMEDIATE MEDICAL CARE IF:   Your pain increases or you are very uncomfortable.  You have shortness of breath or difficulty breathing.  You  cough up blood.  You have worse chest pains, sweating, or vomiting.  You have a fever or persistent symptoms for more than 2-3 days.  You have a fever and your symptoms suddenly get worse. MAKE SURE YOU:   Understand these instructions.  Will watch your condition.  Will get help right away if you are not doing well or get worse.   This information is not intended to replace advice given to you by your health care provider. Make sure you discuss any questions you have with your health care provider.   Document Released: 05/03/2005 Document Revised: 05/14/2013 Document Reviewed: 02/25/2013 [employer] Interactive Patient Education [employer]

## 2015-12-12 NOTE — ED Notes (Signed)
Patient transported to X-ray 

## 2015-12-12 NOTE — ED Provider Notes (Signed)
[facility] Emergency Department Provider Note  ____________________________________________  Time seen: 6:30 PM  I have reviewed the triage vital signs and the nursing notes.   HISTORY  Chief Complaint Shortness of Breath and Chest Pain    HPI Lori Perez is a 20 y.o. female who reports multiple symptoms over the past 3 or 4 days. It started with sore throat rhinorrhea and nonproductive cough, and in 2 or 3 days ago she started having intermittent chest wall pain with coughing. Today the chest wall pain has been constant. It is nonradiating, no shortness of breath, does not have vomiting because of the pain but has had some nausea and vomiting otherwise with this illness. No recent travel trauma hospitalizations or surgeries. No leg swelling or edema. She is eating and drinking normally although she has decreased appetite currently.Chest pain is moderate in intensity, aching. She was given albuterol neb in triage and feels much better afterward.   History reviewed. No pertinent past medical history. None  There are no active problems to display for this patient.    Past Surgical History  Procedure Laterality Date  . Tonsillectomy       Current Outpatient Rx  Name  Route  Sig  Dispense  Refill  . albuterol (PROVENTIL HFA) 108 (90 Base) MCG/ACT inhaler   Inhalation   Inhale 2 puffs into the lungs every 4 (four) hours as needed for wheezing or shortness of breath.   1 Inhaler   0   . cyclobenzaprine (FLEXERIL) 5 MG tablet   Oral   Take 1 tablet (5 mg total) by mouth every 8 (eight) hours as needed for muscle spasms.   30 tablet   1   . diazepam (VALIUM) 2 MG tablet   Oral   Take 1 tablet (2 mg total) by mouth every 8 (eight) hours as needed for muscle spasms. Patient not taking: Reported on 06/24/2015   15 tablet   0   . HYDROcodone-acetaminophen (NORCO) 5-325 MG tablet   Oral   Take 1 tablet by mouth every 6 (six) hours as needed for  moderate pain. Patient not taking: Reported on 06/24/2015   15 tablet   0   . ibuprofen (ADVIL,MOTRIN) 800 MG tablet   Oral   Take 1 tablet (800 mg total) by mouth every 8 (eight) hours as needed for moderate pain. Patient not taking: Reported on 06/24/2015   15 tablet   0   . naproxen (NAPROSYN) 500 MG tablet   Oral   Take 1 tablet (500 mg total) by mouth 2 (two) times daily with a meal.   20 tablet   0   . predniSONE (DELTASONE) 20 MG tablet   Oral   Take 2 tablets (40 mg total) by mouth daily.   8 tablet   0   . ranitidine (ZANTAC) 150 MG capsule   Oral   Take 1 capsule (150 mg total) by mouth 2 (two) times daily.   28 capsule   0      Allergies Review of patient's allergies indicates no known allergies.   History reviewed. No pertinent family history.  Social History Social History  Substance Use Topics  . Smoking status: Current Every Day Smoker -- 0.50 packs/day    Types: Cigarettes  . Smokeless tobacco: None  . Alcohol Use: No    Review of Systems  Constitutional:   No fever or chills.  Eyes:   No vision changes.  ENT:   Positive sore  throat and rhinorrhea. Cardiovascular:   Positive as above chest pain. Respiratory:   Positive nonproductive cough, occasional dyspnea. Gastrointestinal:   Negative for abdominal pain, occasional vomiting.  Genitourinary:   Negative for dysuria or difficulty urinating. Musculoskeletal:   Negative for focal pain or swelling Neurological:   Negative for headaches 10-point ROS otherwise negative.  ____________________________________________   PHYSICAL EXAM:  VITAL SIGNS: ED Triage Vitals  Enc Vitals Group     BP 12/12/15 1709 129/71 mmHg     Pulse Rate 12/12/15 1709 104     Resp 12/12/15 1709 16     Temp 12/12/15 1709 98 F (36.7 C)     Temp Source 12/12/15 1709 Oral     SpO2 12/12/15 1709 94 %     Weight 12/12/15 1709 210 lb (95.255 kg)     Height 12/12/15 1709 5\' 5"  (1.651 m)     Head Cir --      Peak  Flow --      Pain Score 12/12/15 1710 8     Pain Loc --      Pain Edu? --      Excl. in GC? --     Vital signs reviewed, nursing assessments reviewed.   Constitutional:   Alert and oriented. Well appearing and in no distress. Eyes:   No scleral icterus. No conjunctival pallor. PERRL. EOMI.  No nystagmus. ENT   Head:   Normocephalic and atraumatic.   Nose:   Positive nasal congestion. No septal hematoma   Mouth/Throat:   MMM, Positive pharyngeal erythema. No peritonsillar mass.    Neck:   No stridor. No SubQ emphysema. No meningismus. Hematological/Lymphatic/Immunilogical:   No cervical lymphadenopathy. Cardiovascular:   RRR heart rate 90 on my exam. Symmetric bilateral radial and DP pulses.  No murmurs.  Respiratory:   Normal respiratory effort without tachypnea nor retractions. Breath sounds are clear and equal bilaterally. No wheezes/rales/rhonchi. No inducible wheezing or coughing with forceful expiration Gastrointestinal:   Soft and nontender. Non distended. There is no CVA tenderness.  No rebound, rigidity, or guarding. Genitourinary:   deferred Musculoskeletal:   Nontender with normal range of motion in all extremities. No joint effusions.  No lower extremity tenderness.  No edema. Anterior chest is exquisitely tender to the touch over the sternum and cartilaginous margins, which reproduces her chest pain symptoms. Neurologic:   Normal speech and language.  CN 2-10 normal. Motor grossly intact. No gross focal neurologic deficits are appreciated.  Skin:    Skin is warm, dry and intact. No rash noted.  No petechiae, purpura, or bullae.  ____________________________________________    LABS (pertinent positives/negatives) (all labs ordered are listed, but only abnormal results are displayed) Labs Reviewed  BASIC METABOLIC PANEL - Abnormal; Notable for the following:    Calcium 8.5 (*)    All other components within normal limits  CBC - Abnormal; Notable for the  following:    WBC 12.7 (*)    All other components within normal limits  TROPONIN I   ____________________________________________   EKG  Interpreted by me Sinus tachycardia rate 101, normal axis intervals QRS ST segments and T waves.  ____________________________________________    RADIOLOGY  Chest x-ray unremarkable  ____________________________________________   PROCEDURES   ____________________________________________   INITIAL IMPRESSION / ASSESSMENT AND PLAN / ED COURSE  Pertinent labs & imaging results that were available during my care of the patient were reviewed by me and considered in my medical decision making (see chart for details).  Patient  well appearing no acute distress. Symptoms consistent with acute bronchitis from upper respiratory illness. Physical exam also highly consistent with chest wall pain related to costochondritis. We'll start the patient on NSAIDs and prednisone, albuterol, and Zantac for GI protection. Follow-up with primary care. Considering the patient's symptoms, medical history, and physical examination today, I have low suspicion for ACS, PE, TAD, pneumothorax, carditis, mediastinitis, pneumonia, CHF, or sepsis.       ____________________________________________   FINAL CLINICAL IMPRESSION(S) / ED DIAGNOSES  Final diagnoses:  Acute bronchitis, unspecified organism  Costochondritis, acute       Portions of this note were generated with dragon dictation software. Dictation errors may occur despite best attempts at proofreading.   Sharman Cheek, MD 12/12/15 [phone]

## 2016-01-06 ENCOUNTER — Encounter: Payer: Self-pay | Admitting: Obstetrics and Gynecology

## 2016-02-09 ENCOUNTER — Encounter: Payer: Self-pay | Admitting: Obstetrics and Gynecology

## 2016-11-13 IMAGING — CR DG LUMBAR SPINE 2-3V
1 series · 3 of 3 positions shown · non-contrast
Comparison: Plain films lumbar spine 06/12/2015.

CLINICAL DATA: Chronic low back and bilateral hip pain. History of
scoliosis. No known injury. Subsequent encounter.

EXAM:
LUMBAR SPINE - 2-3 VIEW

[Series 1: dg lumbar spine 2-3 views · 0.14mm/px · 3 of 3 slices shown]
[im 1/3]
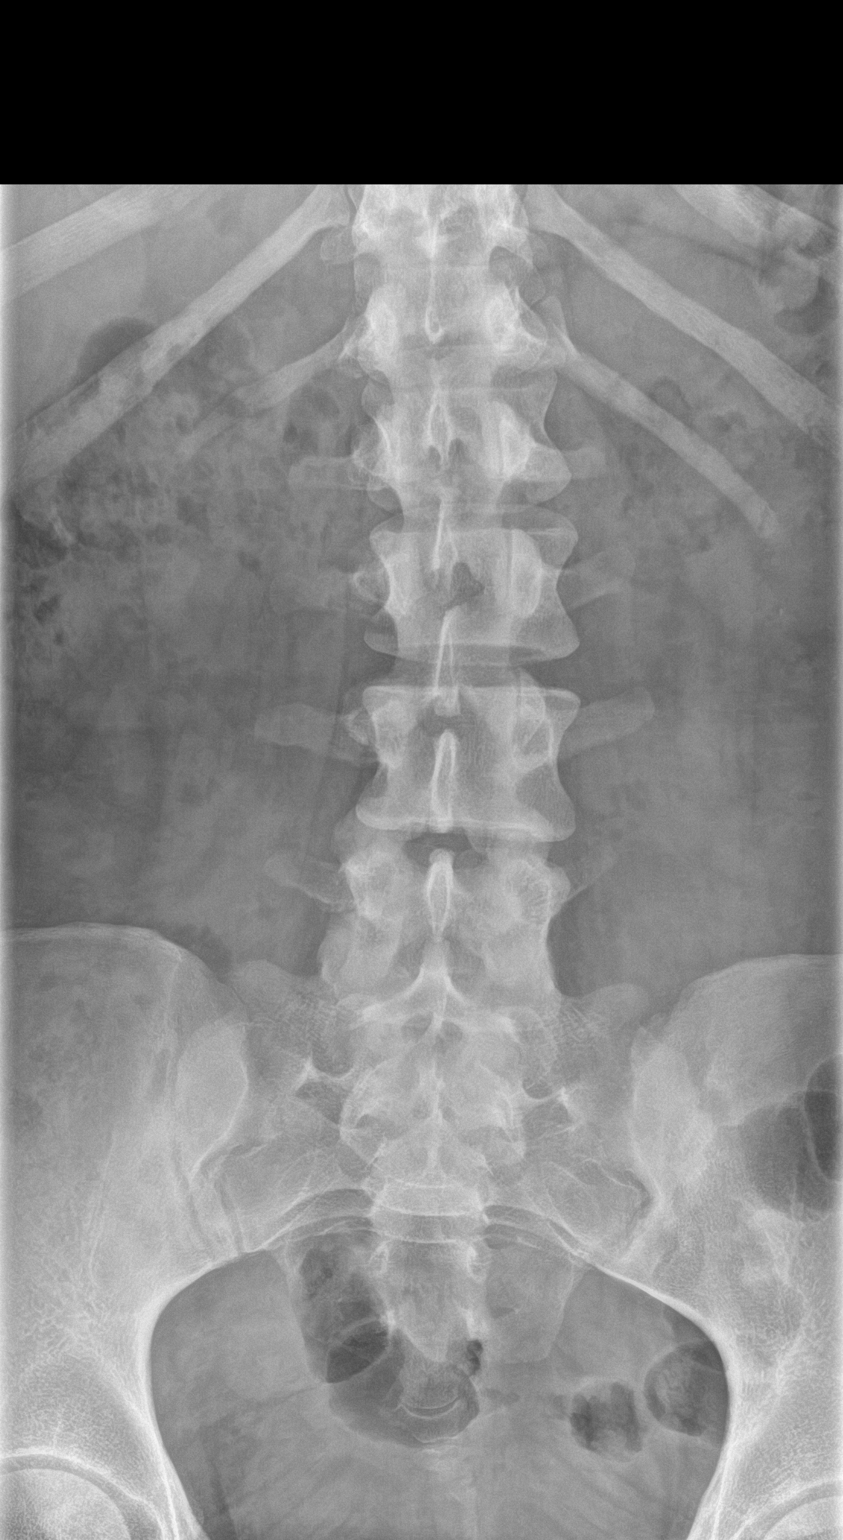
[im 2/3]
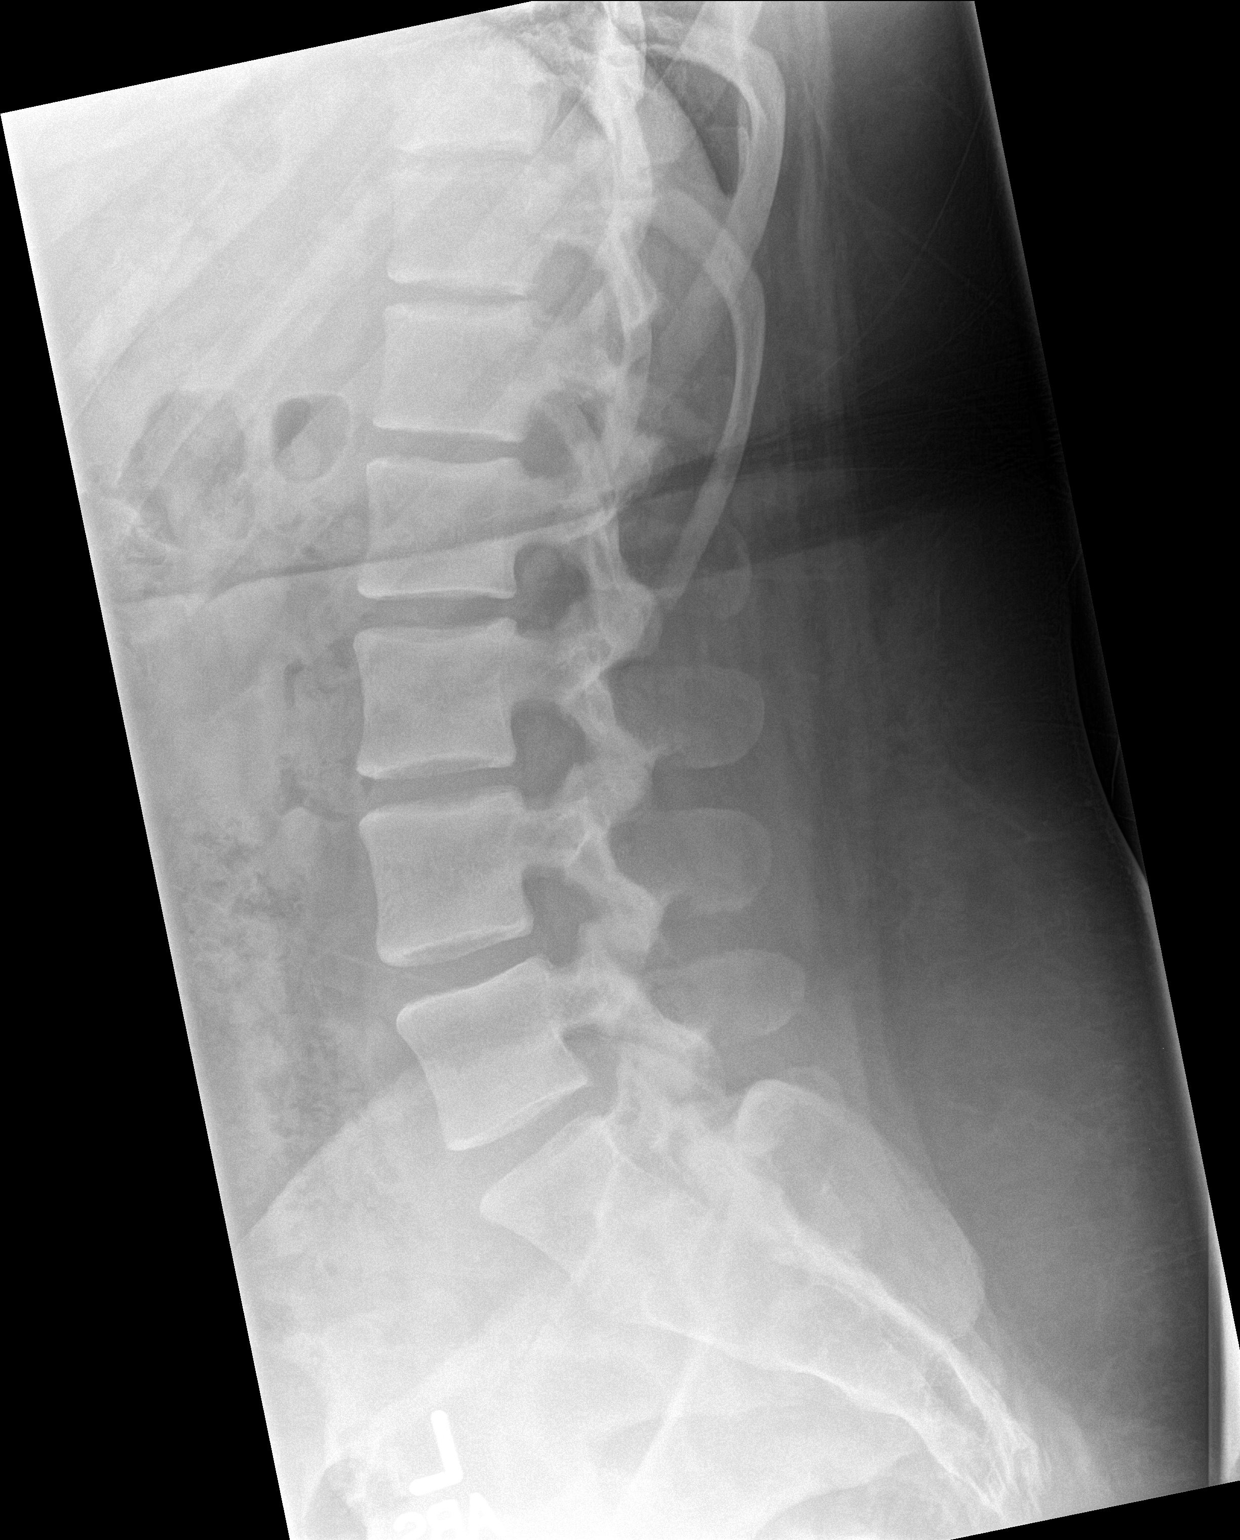
[im 3/3]
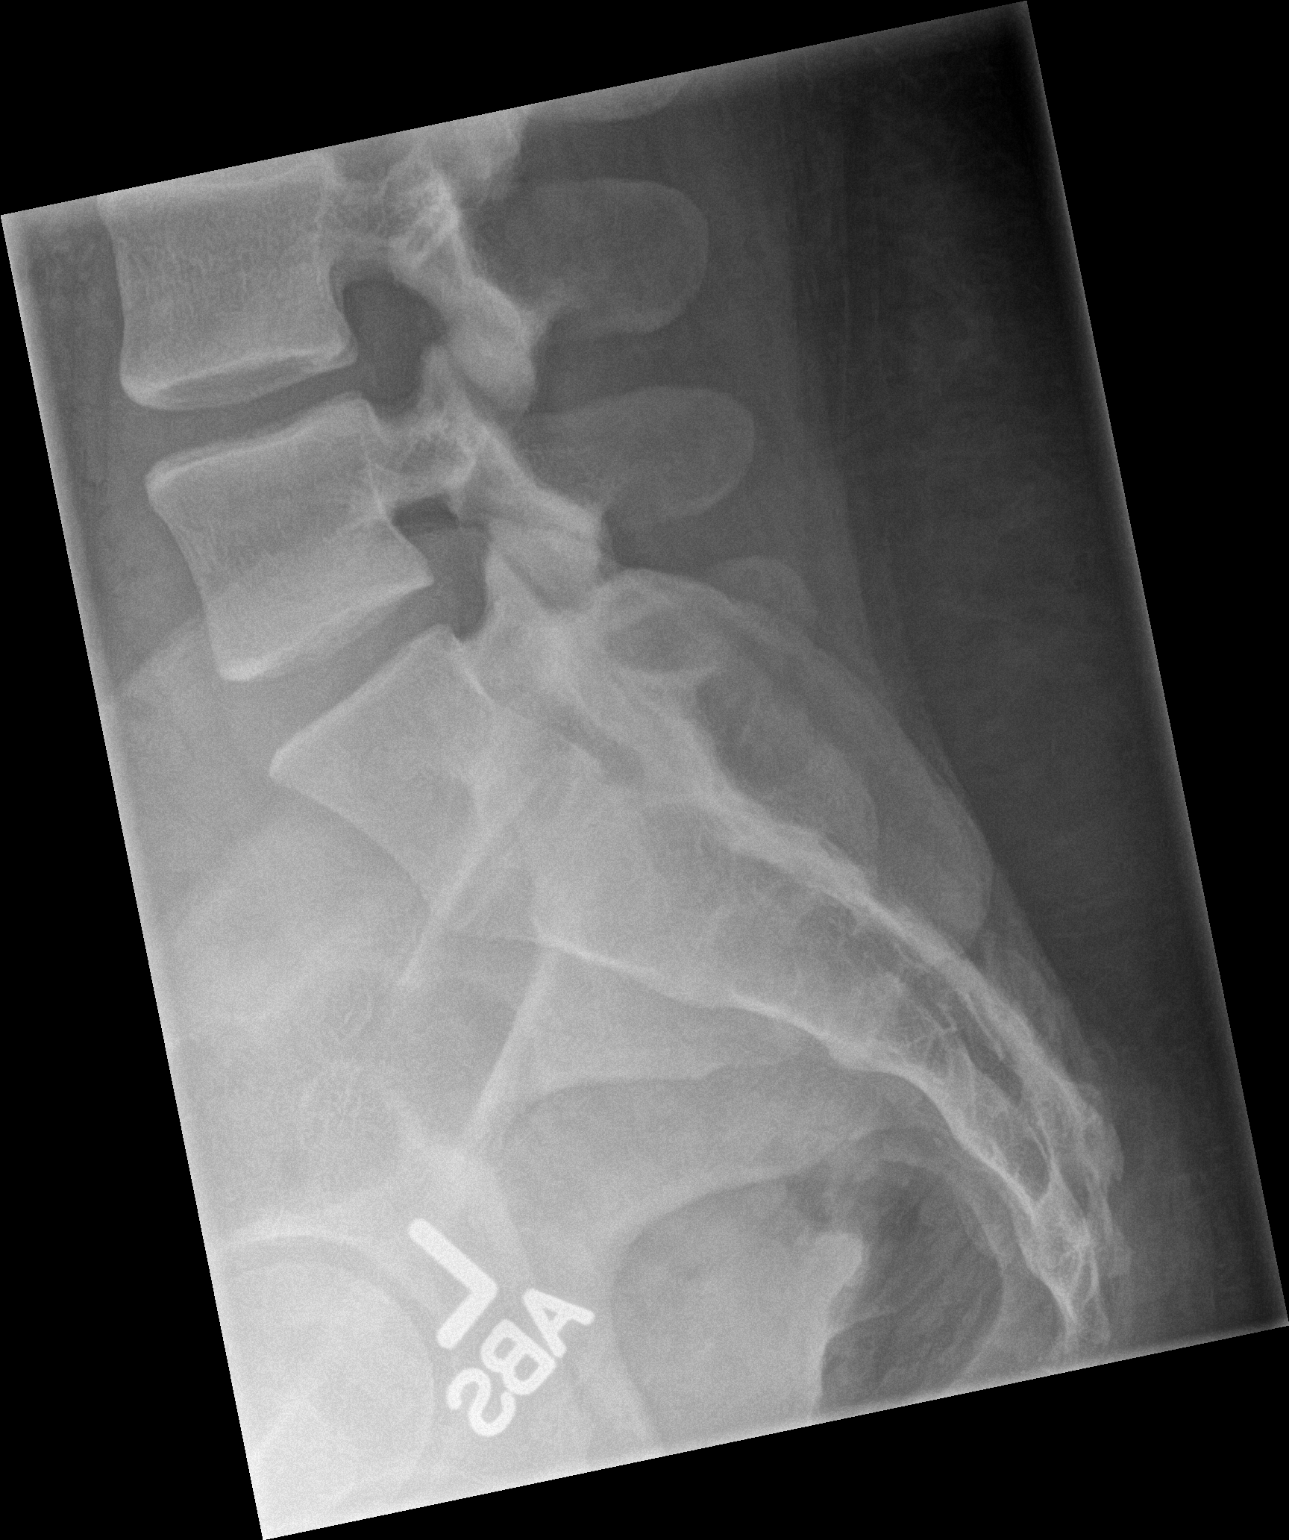

[3 of 3 positions shown; findings below may reference images not displayed]

FINDINGS: Mild convex left curvature is unchanged. Vertebral body height and
alignment are normal. Intervertebral disc space height is normal. No
pars interarticularis defect is identified. Paraspinous structures
are unremarkable.
IMPRESSION: Mild convex left scoliosis.  Otherwise negative.

## 2017-01-07 IMAGING — CR DG CHEST 2V
1 series · 2 of 2 positions shown · non-contrast
Comparison: None.

CLINICAL DATA: Shortness of breath for 24 hours

EXAM:
CHEST  2 VIEW

[Series 1: dg chest 2 view · 0.14mm/px · 2 of 2 slices shown]
[im 1/2]
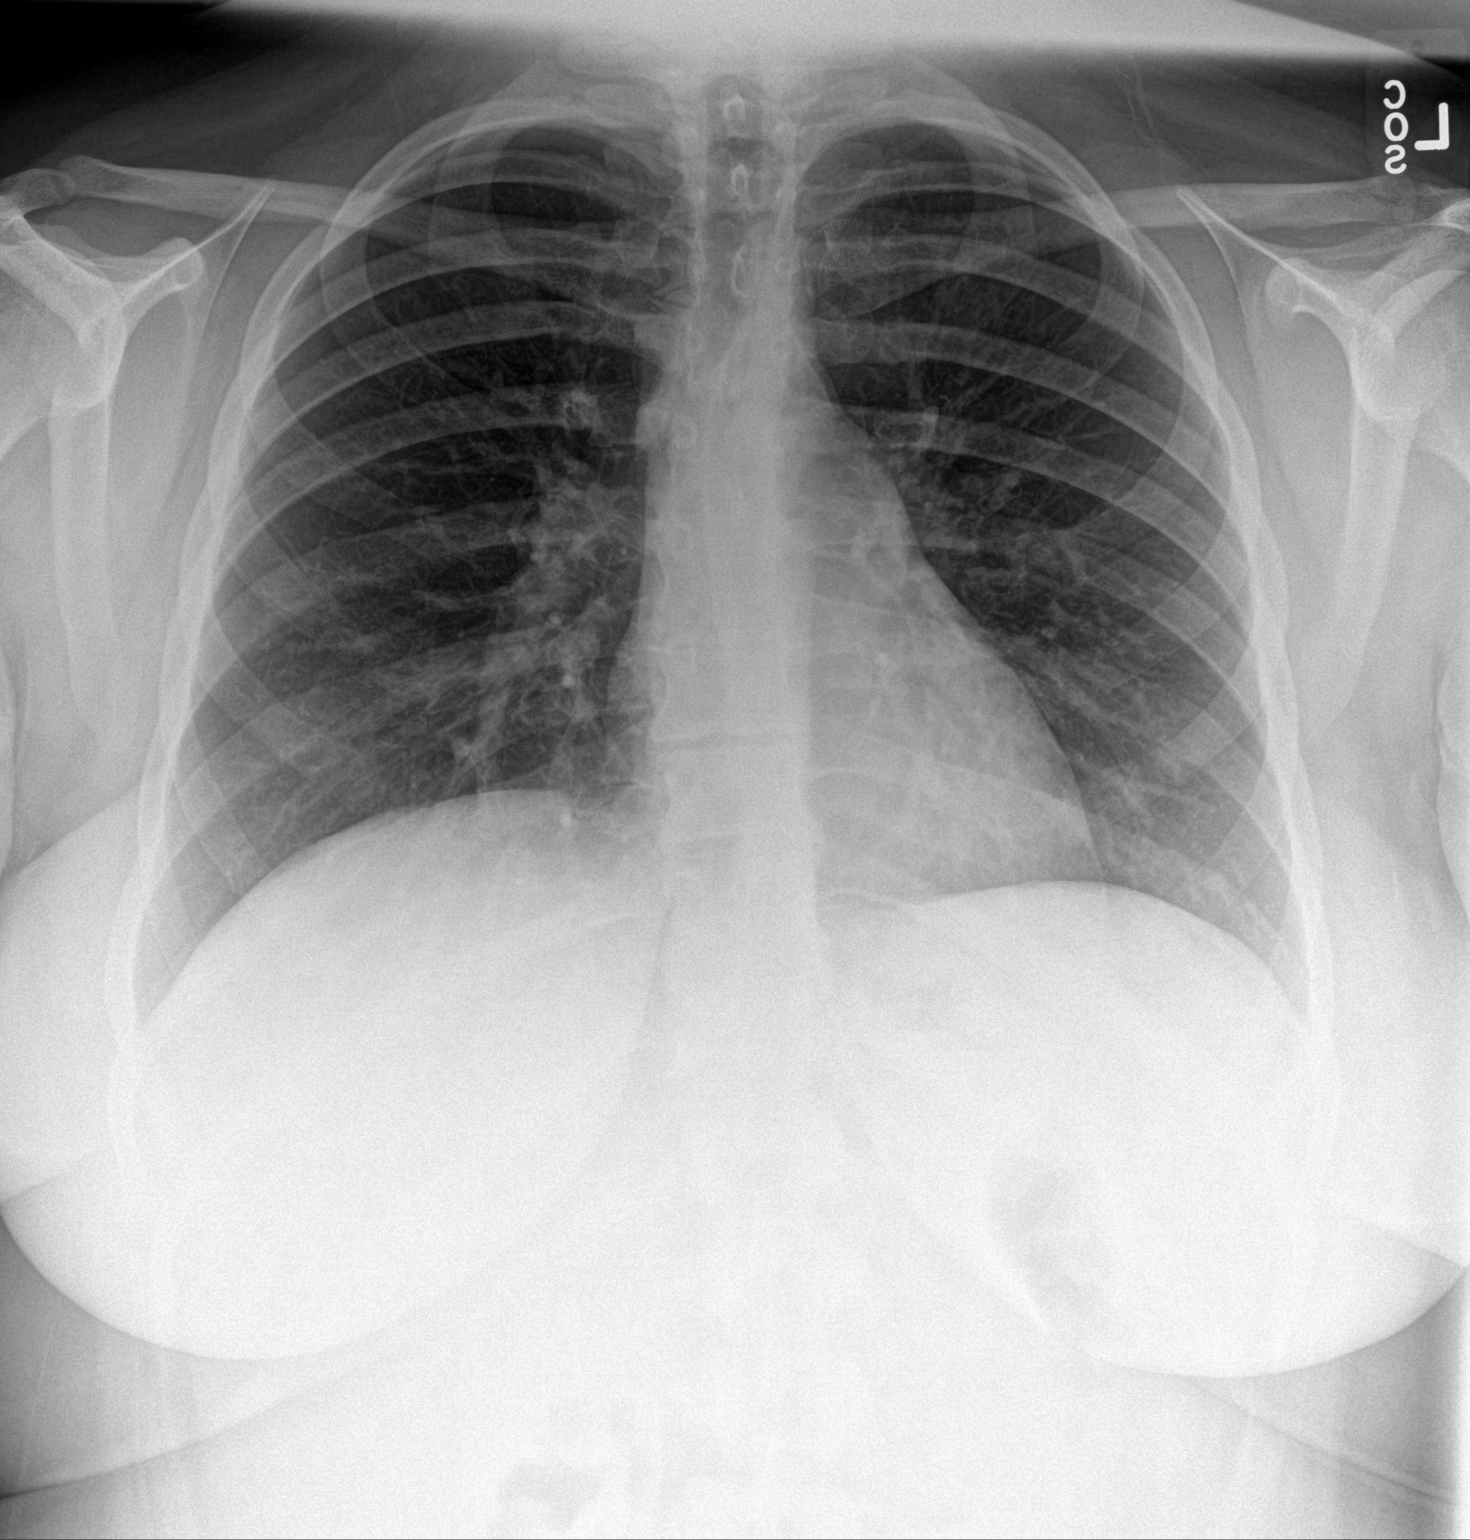
[im 2/2]
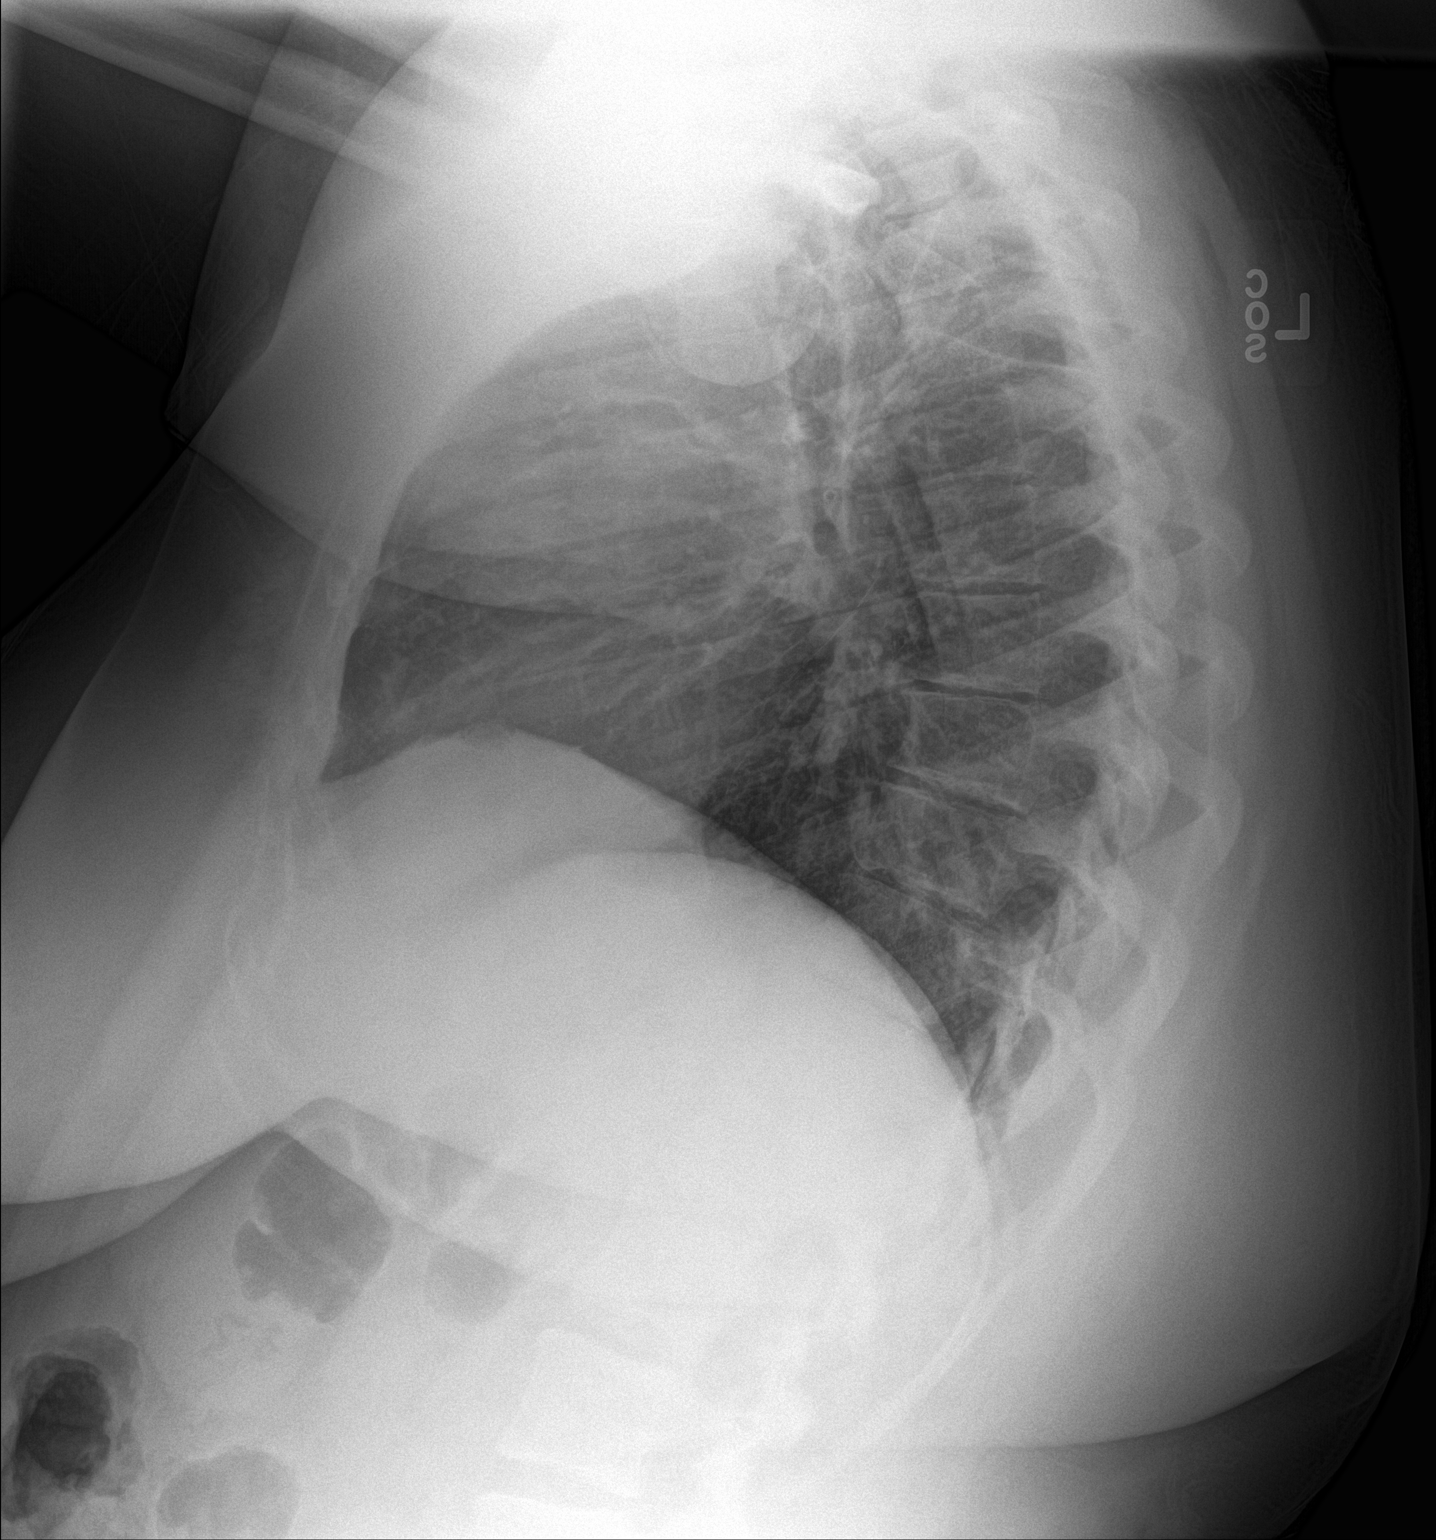

[2 of 2 positions shown; findings below may reference images not displayed]

FINDINGS: Normal heart size and mediastinal contours. No acute infiltrate or
edema. No effusion or pneumothorax. No osseous findings.
IMPRESSION: Negative chest.

## 2017-10-05 DEATH — deceased
